# Patient Record
Sex: Female | Born: 2000 | Race: White | Hispanic: No | Marital: Single | Smoking: Never smoker
Health system: Southern US, Community
[De-identification: ages and names within clinical notes are randomized; demographics above are authoritative.]

## PROBLEM LIST (undated history)

## (undated) DIAGNOSIS — D649 Anemia, unspecified: Secondary | ICD-10-CM

## (undated) HISTORY — PX: PILONIDAL CYST / SINUS EXCISION: SUR543

## (undated) HISTORY — DX: Anemia, unspecified: D64.9

---
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
## 2020-06-20 ENCOUNTER — Emergency Department (HOSPITAL_BASED_OUTPATIENT_CLINIC_OR_DEPARTMENT_OTHER)
Admission: EM | Admit: 2020-06-20 | Discharge: 2020-06-21 | Disposition: A | Discharge status: Home / Self Care | Payer: Self-pay | Attending: Emergency Medicine | Admitting: Emergency Medicine

## 2020-06-20 ENCOUNTER — Encounter (HOSPITAL_BASED_OUTPATIENT_CLINIC_OR_DEPARTMENT_OTHER): Payer: Self-pay | Admitting: *Deleted

## 2020-06-20 ENCOUNTER — Other Ambulatory Visit: Payer: Self-pay

## 2020-06-20 DIAGNOSIS — L0231 Cutaneous abscess of buttock: Secondary | ICD-10-CM | POA: Insufficient documentation

## 2020-06-20 MED ORDER — LIDOCAINE-EPINEPHRINE (PF) 2 %-1:200000 IJ SOLN
10.0000 mL | Freq: Once | INTRAMUSCULAR | Status: AC
  Administered 2020-06-20: 10 mL
  Filled 2020-06-20: qty 20

## 2020-06-20 MED ORDER — LIDOCAINE-EPINEPHRINE-TETRACAINE (LET) TOPICAL GEL
3.0000 mL | Freq: Once | TOPICAL | Status: AC
Start: 2020-06-20 — End: 2020-06-20
  Administered 2020-06-20: 3 mL via TOPICAL
  Filled 2020-06-20: qty 3

## 2020-06-20 NOTE — ED Provider Notes (Signed)
[facility] EMERGENCY DEPARTMENT Provider Note   CSN: [id number] Arrival date & time: 06/20/20  1729     History Chief Complaint  Patient presents with  . Abscess    Patricia Bautista is a 20 y.o. female otherwise healthy up-to-date on all vaccinations including tetanus.  Patient presents with her mother today for concern of pilonidal cyst onset 4 days ago.  Patient reports pain to the superior left buttock which is been constant worsening moderate intensity sharp worsened with sitting nonradiating no alleviating factors.  Patient was unaware that she had a fever prior to arrival temperature of 99.6 F in the triage area.  She denies having any history of abscess before, denies rectal pain or blood in the stool.  Denies abdominal pain nausea vomiting or any additional concerns.  HPI     History reviewed. No pertinent past medical history.  There are no problems to display for this patient.   History reviewed. No pertinent surgical history.   OB History   No obstetric history on file.     No family history on file.  Social History   Tobacco Use  . Smoking status: Never Smoker  . Smokeless tobacco: Never Used  Substance Use Topics  . Alcohol use: Never  . Drug use: Never    Home Medications Prior to Admission medications   Medication Sig Start Date End Date Taking? Authorizing Provider  sulfamethoxazole-trimethoprim (BACTRIM DS) 800-160 MG tablet Take 1 tablet by mouth 2 (two) times daily for 7 days. 06/21/20 06/28/20 Yes Nuala Alpha A, PA-C    Allergies    Patient has no known allergies.  Review of Systems   Review of Systems  Constitutional: Negative for chills and fever (99.6 F).  Gastrointestinal: Negative.  Negative for abdominal pain, nausea and vomiting.  Skin: Positive for color change and wound (Abscess).    Physical Exam Updated Vital Signs BP 117/71 (BP Location: Right Arm)   Pulse (!) 103   Temp 98.4 F (36.9 C) (Oral)   Resp 16    Ht 5\' 5"  (1.651 m)   Wt 62.3 kg   LMP 05/23/2020   SpO2 100%   BMI 22.86 kg/m   Physical Exam Constitutional:      General: She is not in acute distress.    Appearance: Normal appearance. She is well-developed. She is not ill-appearing or diaphoretic.  HENT:     Head: Normocephalic and atraumatic.  Eyes:     General: Vision grossly intact. Gaze aligned appropriately.     Pupils: Pupils are equal, round, and reactive to light.  Neck:     Trachea: Trachea and phonation normal.  Pulmonary:     Effort: Pulmonary effort is normal. No respiratory distress.  Abdominal:     General: There is no distension.     Palpations: Abdomen is soft.     Tenderness: There is no abdominal tenderness. There is no guarding or rebound.  Musculoskeletal:        General: Normal range of motion.     Cervical back: Normal range of motion.  Skin:    General: Skin is warm and dry.          Comments: Evaluation chaperoned by patient's mother and Passenger transport manager.  Patient with abscess at the superior left buttock, moderate erythema and fluctuance present approximately 1.5 cm in diameter.  No tracking towards the rectum.  Neurological:     Mental Status: She is alert.     GCS: GCS eye subscore is  4. GCS verbal subscore is 5. GCS motor subscore is 6.     Comments: Speech is clear and goal oriented, follows commands Major Cranial nerves without deficit, no facial droop Moves extremities without ataxia, coordination intact  Psychiatric:        Behavior: Behavior normal.     ED Results / Procedures / Treatments   Labs (all labs ordered are listed, but only abnormal results are displayed) Labs Reviewed  AEROBIC CULTURE (SUPERFICIAL SPECIMEN)  PREGNANCY, URINE    EKG None  Radiology No results found.  Procedures Ultrasound ED Soft Tissue  Date/Time: 06/20/2020 10:57 PM Performed by: Patricia Salinas, PA-C Authorized by: Patricia Salinas, PA-C   Procedure details:    Indications: localization  of abscess and evaluate for cellulitis     Transverse view:  Visualized   Longitudinal view:  Visualized   Images: archived   Location:    Location: buttocks     Side:  Left Findings:     abscess present    cellulitis present    no foreign body present Comments:     Ultrasound exam chaperoned by Patricia Bautista and patient's mother.  Marland Kitchen.Incision and Drainage  Date/Time: 06/20/2020 11:27 PM Performed by: Patricia Salinas, PA-C Authorized by: Patricia Salinas, PA-C   Consent:    Consent obtained:  Verbal   Consent given by:  Patient and parent   Risks, benefits, and alternatives were discussed: yes     Risks discussed:  Bleeding, incomplete drainage, infection, damage to other organs and pain Universal protocol:    Procedure explained and questions answered to patient or proxy's satisfaction: yes     Relevant documents present and verified: yes     Test results available : yes     Imaging studies available: yes     Required blood products, implants, devices, and special equipment available: yes     Site/side marked: yes     Immediately prior to procedure, a time out was called: yes     Patient identity confirmed:  Verbally with patient and arm band Location:    Type:  Abscess   Size:  1.5 cm   Location:  Lower extremity   Lower extremity location:  Buttock   Buttock location:  L buttock Pre-procedure details:    Skin preparation:  Povidone-iodine Anesthesia:    Anesthesia method:  Topical application and local infiltration   Topical anesthetic:  LET   Local anesthetic:  Lidocaine 1% w/o epi Procedure type:    Complexity:  Simple Procedure details:    Ultrasound guidance: yes     Needle aspiration: yes     Needle size:  25 G   Incision types:  Single straight   Drainage:  Purulent and bloody   Drainage amount:  Scant   Wound treatment:  Wound left open   Packing materials:  1/4 in iodoform gauze   Amount 1/4" iodoform:  1cm in 1 cm out Post-procedure details:     Procedure completion:  Tolerated well, no immediate complications Comments:     Procedure chaperoned by Patricia Bautista and patient's mother.   (including critical care time)  Medications Ordered in ED Medications  lidocaine-EPINEPHrine (XYLOCAINE W/EPI) 2 %-1:200000 (PF) injection 10 mL (10 mLs Infiltration Given 06/20/20 2255)  lidocaine-EPINEPHrine-tetracaine (LET) topical gel (3 mLs Topical Given 06/20/20 2255)    ED Course  I have reviewed the triage vital signs and the nursing notes.  Pertinent labs & imaging results that were available during my  care of the patient were reviewed by me and considered in my medical decision making (see chart for details).    MDM Rules/Calculators/A&P                         Additional history obtained from: 1. Nursing notes from this visit. 2. Family, patient's mother at bedside. ---------- Patient with single skin abscess of the right superior buttock amenable to incision and drainage.  Abscess does not appear to be a pilonidal at this time seems more lateral. Abscess drained as above small amount of packing was placed. Patient informed to have wound recheck in 2 days. I have encouraged home warm soaks and flushing. Plan to discharge patient. Patient given extensive return precautions. Patient states understanding of return precautions and is agreeable with plan.  Will start patient on Bactrim for surrounding cellulitis.  Urine pregnancy test negative.  There is no indication for imaging at this time no tracking or rectal pain to suggest perianal abscess.  Additionally vital signs are stable patient minimally tachycardic suspect secondary to pain no true fever patient does not appear toxic or septic and appears stable for outpatient treatment.  Wound culture sent, patient and mother informed that if it grows bacteria that require different antibiotic you will be contacted by [facility] with a new prescription.  Referral given to [facility] surgery at family  request in case pilonidal involvement.  At this time there does not appear to be any evidence of an acute emergency medical condition and the patient appears stable for discharge with appropriate outpatient follow up. Diagnosis was discussed with patient who verbalizes understanding of care plan and is agreeable to discharge. I have discussed return precautions with patient and mother who verbalizes understanding. Patient encouraged to follow-up with their PCP. All questions answered.   Note: Portions of this report may have been transcribed using voice recognition software. Every effort was made to ensure accuracy; however, inadvertent computerized transcription errors may still be present. Final Clinical Impression(s) / ED Diagnoses Final diagnoses:  Abscess of buttock, left    Rx / DC Orders ED Discharge Orders         Ordered    sulfamethoxazole-trimethoprim (BACTRIM DS) 800-160 MG tablet  2 times daily        06/21/20 0002           Elizabeth Palau 06/21/20 0003    Tilden Fossa, MD 06/24/20 1909

## 2020-06-20 NOTE — Discharge Instructions (Addendum)
At this time there does not appear to be the presence of an emergent medical condition, however there is always the potential for conditions to change. Please read and follow the below instructions.  Please return to the Emergency Department immediately for any new or worsening symptoms. Please be sure to follow up with your Primary Care Provider within one week regarding your visit today; please call their office to schedule an appointment even if you are feeling better for a follow-up visit. Please take your antibiotic Bactrim as prescribed until complete to help with your symptoms.  Please drink enough water to avoid dehydration and get plenty of rest. You are referred to [facility] surgical services today. It does not appear that your abscess with today was due to a pilonidal cyst rather than a normal skin abscess however the specialist of [place] surgery can follow-up if the abscess returns. Please have the area rechecked in 2-3 days, can be rechecked by your primary care provider, and urgent care or here at the emergency department.  Go to the nearest Emergency Department immediately if: You have fever or chills You have very bad (severe) pain. You see red streaks on your skin spreading away from the abscess. You have abdominal pain nausea or vomiting You have pain with bowel movements (pain when you poop) You have blood in your poop You have any new/concerning or worsening of symptoms.   Please read the additional information packets attached to your discharge summary.  Do not take your medicine if  develop an itchy rash, swelling in your mouth or lips, or difficulty breathing; call 911 and seek immediate emergency medical attention if this occurs.  You may review your lab tests and imaging results in their entirety on your MyChart account.  Please discuss all results of fully with your primary care provider and other specialist at your follow-up visit.  Note: Portions  of this text may have been transcribed using voice recognition software. Every effort was made to ensure accuracy; however, inadvertent computerized transcription errors may still be present.

## 2020-06-20 NOTE — ED Notes (Signed)
Motrin and tylenol offered for low grade fever.  Pt declined at this time.

## 2020-06-20 NOTE — ED Triage Notes (Signed)
Pilonidal cyst x 4 days.

## 2020-06-21 LAB — PREGNANCY, URINE: Preg Test, Ur: NEGATIVE

## 2020-06-21 MED ORDER — SULFAMETHOXAZOLE-TRIMETHOPRIM 800-160 MG PO TABS: 1.0000 | ORAL_TABLET | Freq: Two times a day (BID) | ORAL | 0 refills | Status: AC

## 2020-06-22 LAB — AEROBIC CULTURE  (SUPERFICIAL SPECIMEN)

## 2020-06-23 LAB — AEROBIC CULTURE W GRAM STAIN (SUPERFICIAL SPECIMEN): Culture: NORMAL

## 2021-01-22 ENCOUNTER — Ambulatory Visit: Payer: Medicaid Other

## 2021-01-22 NOTE — Progress Notes (Signed)
Call patient x 2 for new ob intake appointment. No answer, voice mail full.

## 2021-01-28 ENCOUNTER — Ambulatory Visit (INDEPENDENT_AMBULATORY_CARE_PROVIDER_SITE_OTHER): Payer: Medicaid Other | Admitting: Obstetrics and Gynecology

## 2021-01-28 ENCOUNTER — Other Ambulatory Visit (HOSPITAL_COMMUNITY)
Admission: RE | Admit: 2021-01-28 | Discharge: 2021-01-28 | Disposition: A | Discharge status: Home / Self Care | Payer: Medicaid Other | Source: Ambulatory Visit | Attending: Obstetrics and Gynecology | Admitting: Obstetrics and Gynecology

## 2021-01-28 ENCOUNTER — Encounter: Payer: Self-pay | Admitting: Obstetrics and Gynecology

## 2021-01-28 ENCOUNTER — Other Ambulatory Visit: Payer: Self-pay

## 2021-01-28 DIAGNOSIS — Z3A17 17 weeks gestation of pregnancy: Secondary | ICD-10-CM | POA: Insufficient documentation

## 2021-01-28 DIAGNOSIS — Z3685 Encounter for antenatal screening for Streptococcus B: Secondary | ICD-10-CM | POA: Diagnosis not present

## 2021-01-28 DIAGNOSIS — Z3480 Encounter for supervision of other normal pregnancy, unspecified trimester: Secondary | ICD-10-CM | POA: Diagnosis not present

## 2021-01-28 DIAGNOSIS — O219 Vomiting of pregnancy, unspecified: Secondary | ICD-10-CM | POA: Insufficient documentation

## 2021-01-28 DIAGNOSIS — O99012 Anemia complicating pregnancy, second trimester: Secondary | ICD-10-CM

## 2021-01-28 DIAGNOSIS — Z31430 Encounter of female for testing for genetic disease carrier status for procreative management: Secondary | ICD-10-CM | POA: Diagnosis not present

## 2021-01-28 DIAGNOSIS — Z3482 Encounter for supervision of other normal pregnancy, second trimester: Secondary | ICD-10-CM | POA: Diagnosis not present

## 2021-01-28 MED ORDER — DOXYLAMINE-PYRIDOXINE 10-10 MG PO TBEC: 2.0000 | DELAYED_RELEASE_TABLET | Freq: Every day | ORAL | 5 refills | Status: DC

## 2021-01-28 MED ORDER — BLOOD PRESSURE KIT DEVI
1.0000 | 0 refills | Status: DC | PRN
Start: 2021-01-28 — End: 2021-07-01

## 2021-01-28 NOTE — Addendum Note (Signed)
Addended by: Charlsie Quest B on: 01/28/2021 10:29 AM   Modules accepted: Orders

## 2021-01-28 NOTE — Progress Notes (Signed)
Pt presents today for NOB visit. Pt states she is having issues with morning N/V. Only vomiting once in the morning. She is not currently taking anything.

## 2021-01-28 NOTE — Patient Instructions (Signed)
The nature of [facility] with multiple MDs and other Advanced Practice Providers was explained to patient; also emphasized that residents, students are part of our team.   For colds and allergies  Any anti-histamine including benadryl, allegra, claritin, etc.  Sudafed but not phenylephrine  Mucinex  Robitussin  For Reflux/heartburn  Pepcid Zantac Tums Prilosec Prevacid  For yeast infections  Monistat  For constipation  Colace  For minor aches and pains  Tylenol-do not take more than 4000mg in 24 hours. Therma-care or like heat packs  

## 2021-01-28 NOTE — Progress Notes (Signed)
INITIAL PRENATAL VISIT NOTE  Subjective:  Patricia Bautista is a 20 y.o. G1P0 at [redacted]w[redacted]d by sure LMP being seen today for her initial prenatal visit. She has an obstetric history significant for nothing. She has a medical history significant for uncomplicated medical history.  Patient reports nausea.  Contractions: Not present. Vag. Bleeding: None.  Movement: Absent. Denies leaking of fluid.    No past medical history on file.  No past surgical history on file.  OB History  Gravida Para Term Preterm AB Living  1            SAB IAB Ectopic Multiple Live Births               # Outcome Date GA Lbr Len/2nd Weight Sex Delivery Anes PTL Lv  1 Current             Social History   Socioeconomic History   Marital status: Single    Spouse name: Not on file   Number of children: Not on file   Years of education: Not on file   Highest education level: Not on file  Occupational History   Not on file  Tobacco Use   Smoking status: Never   Smokeless tobacco: Never  Substance and Sexual Activity   Alcohol use: Never   Drug use: Never   Sexual activity: Not on file  Other Topics Concern   Not on file  Social History Narrative   Not on file   Social Determinants of Health   Financial Resource Strain: Not on file  Food Insecurity: Not on file  Transportation Needs: Not on file  Physical Activity: Not on file  Stress: Not on file  Social Connections: Not on file    No family history on file.   Current Outpatient Medications:    Doxylamine-Pyridoxine (DICLEGIS) 10-10 MG TBEC, Take 2 tablets by mouth at bedtime. If symptoms persist, add one tablet in the morning and one in the afternoon, Disp: 100 tablet, Rfl: 5   Prenatal MV & Min w/FA-DHA (PRENATAL GUMMIES) 0.18-25 MG CHEW, Chew by mouth., Disp: , Rfl:   No Known Allergies  Review of Systems: Negative except for what is mentioned in HPI.  Objective:   Vitals:   01/28/21 0909  BP: 116/72  Pulse: 91  Weight: 141 lb (64  kg)    Fetal Status: Fetal Heart Rate (bpm): 152   Movement: Absent     Physical Exam: BP 116/72   Pulse 91   Wt 141 lb (64 kg)   LMP 09/28/2020 (Exact Date)   BMI 23.46 kg/m  CONSTITUTIONAL: Well-developed, well-nourished female in no acute distress.  NEUROLOGIC: Alert and oriented to person, place, and time. Normal reflexes, muscle tone coordination. No cranial nerve deficit noted. PSYCHIATRIC: Normal mood and affect. Normal behavior. Normal judgment and thought content. SKIN: Skin is warm and dry. No rash noted. Not diaphoretic. No erythema. No pallor. HENT:  Normocephalic, atraumatic, External right and left ear normal. Oropharynx is clear and moist EYES: Conjunctivae and EOM are normal.  NECK: Normal range of motion, supple, no masses CARDIOVASCULAR: Normal heart rate noted, regular rhythm RESPIRATORY: Effort and breath sounds normal, no problems with respiration noted BREASTS: deferred ABDOMEN: Soft, nontender, nondistended, gravid. GU: normal appearing external female genitalia, nulliparous normal appearing cervix, scant white discharge in vagina, no lesions noted Bimanual: 18 weeks sized uterus, no adnexal tenderness or palpable lesions noted MUSCULOSKELETAL: Normal range of motion. EXT:  No edema and no tenderness. 2+ distal  pulses.   Assessment and Plan:  Pregnancy: G1P0 at [redacted]w[redacted]d by LMP  1. Supervision of other normal pregnancy, antepartum Continue routine care, will schedule anatomy scan  - Cervicovaginal ancillary only( [place]) - Culture, OB Urine - CBC/D/Plt+RPR+Rh+ABO+RubIgG... - Genetic Screening - AFP, Serum, Open Spina Bifida - [place] MFM OB DETAIL +14 WK; Future  2. [redacted] weeks gestation of pregnancy   3. Nausea and vomiting in pregnancy Trial of diclegis  - Doxylamine-Pyridoxine (DICLEGIS) 10-10 MG TBEC; Take 2 tablets by mouth at bedtime. If symptoms persist, add one tablet in the morning and one in the afternoon  Dispense: 100 tablet; Refill:  5   Preterm labor symptoms and general obstetric precautions including but not limited to vaginal bleeding, contractions, leaking of fluid and fetal movement were reviewed in detail with the patient.  Please refer to After Visit Summary for other counseling recommendations.   Return in about 4 weeks (around 02/25/2021) for ROB, in person.  Warden Fillers 01/28/2021 9:47 AM

## 2021-01-29 LAB — CERVICOVAGINAL ANCILLARY ONLY
Chlamydia: NEGATIVE
Comment: NEGATIVE
Comment: NEGATIVE
Comment: NORMAL
Neisseria Gonorrhea: NEGATIVE
Trichomonas: NEGATIVE

## 2021-01-30 LAB — CBC/D/PLT+RPR+RH+ABO+RUBIGG...
Antibody Screen: NEGATIVE
Basophils Absolute: 0.1 10*3/uL (ref 0.0–0.2)
Basos: 1 %
EOS (ABSOLUTE): 0.1 10*3/uL (ref 0.0–0.4)
Eos: 1 %
HCV Ab: <0.1 s/co ratio (ref 0.0–0.9)
HIV Screen 4th Generation wRfx: NONREACTIVE
Hematocrit: 27.5 % — ABNORMAL LOW (ref 34.0–46.6)
Hemoglobin: 7.7 g/dL — ABNORMAL LOW (ref 11.1–15.9)
Hepatitis B Surface Ag: NEGATIVE
Immature Grans (Abs): 0.1 10*3/uL (ref 0.0–0.1)
Immature Granulocytes: 1 %
Lymphocytes Absolute: 1.6 10*3/uL (ref 0.7–3.1)
Lymphs: 16 %
MCH: 19 pg — ABNORMAL LOW (ref 26.6–33.0)
MCHC: 28 g/dL — ABNORMAL LOW (ref 31.5–35.7)
MCV: 68 fL — ABNORMAL LOW (ref 79–97)
Monocytes Absolute: 0.5 10*3/uL (ref 0.1–0.9)
Monocytes: 5 %
Neutrophils Absolute: 7.8 10*3/uL — ABNORMAL HIGH (ref 1.4–7.0)
Neutrophils: 76 %
Platelets: 296 10*3/uL (ref 150–450)
RBC: 4.06 x10E6/uL (ref 3.77–5.28)
RDW: 18.2 % — ABNORMAL HIGH (ref 11.7–15.4)
RPR Ser Ql: NONREACTIVE
Rh Factor: POSITIVE
Rubella Antibodies, IGG: 9.62 index (ref >0.99)
WBC: 10 10*3/uL (ref 3.4–10.8)

## 2021-01-30 LAB — AFP, SERUM, OPEN SPINA BIFIDA
AFP MoM: 1.28
AFP Value: 55.2 ng/mL
Gest. Age on Collection Date: 17.3 weeks
Maternal Age At EDD: 21 yr
OSBR Risk 1 IN: 5096
Test Results:: NEGATIVE
Weight: 141 [lb_av]

## 2021-01-30 LAB — CULTURE, OB URINE

## 2021-01-30 LAB — HCV INTERPRETATION

## 2021-01-30 LAB — URINE CULTURE, OB REFLEX: Organism ID, Bacteria: NO GROWTH

## 2021-02-02 DIAGNOSIS — O99012 Anemia complicating pregnancy, second trimester: Secondary | ICD-10-CM | POA: Insufficient documentation

## 2021-02-02 MED ORDER — IRON SUCROSE 20 MG/ML IV SOLN
500.0000 mg | Freq: Once | INTRAVENOUS | Status: DC
Start: 2021-02-02 — End: 2021-02-02

## 2021-02-02 NOTE — Addendum Note (Signed)
Addended by: Warden Fillers on: 02/02/2021 08:19 AM   Modules accepted: Orders

## 2021-02-03 ENCOUNTER — Ambulatory Visit: Payer: Medicaid Other | Attending: Obstetrics and Gynecology

## 2021-02-03 ENCOUNTER — Encounter: Payer: Self-pay | Admitting: *Deleted

## 2021-02-03 ENCOUNTER — Other Ambulatory Visit: Payer: Self-pay

## 2021-02-03 ENCOUNTER — Other Ambulatory Visit: Payer: Self-pay | Admitting: Obstetrics and Gynecology

## 2021-02-03 ENCOUNTER — Ambulatory Visit: Payer: Medicaid Other | Admitting: *Deleted

## 2021-02-03 VITALS — BP 116/61 | HR 104

## 2021-02-03 DIAGNOSIS — Z3480 Encounter for supervision of other normal pregnancy, unspecified trimester: Secondary | ICD-10-CM | POA: Insufficient documentation

## 2021-02-05 ENCOUNTER — Encounter: Payer: Self-pay | Admitting: Obstetrics and Gynecology

## 2021-02-11 ENCOUNTER — Encounter: Payer: Self-pay | Admitting: Obstetrics and Gynecology

## 2021-02-12 ENCOUNTER — Ambulatory Visit (HOSPITAL_COMMUNITY)
Admission: RE | Admit: 2021-02-12 | Discharge: 2021-02-12 | Disposition: A | Discharge status: Home / Self Care | Payer: Medicaid Other | Source: Ambulatory Visit | Attending: Obstetrics and Gynecology | Admitting: Obstetrics and Gynecology

## 2021-02-12 DIAGNOSIS — O99012 Anemia complicating pregnancy, second trimester: Secondary | ICD-10-CM | POA: Diagnosis not present

## 2021-02-12 MED ORDER — SODIUM CHLORIDE 0.9 % IV SOLN
500.0000 mg | Freq: Once | INTRAVENOUS | Status: AC
  Administered 2021-02-12: 500 mg via INTRAVENOUS
  Filled 2021-02-12: qty 25

## 2021-02-12 NOTE — Progress Notes (Signed)
Patient stated she noticed little swelling to right hand when her 30 minutes observation period was complete and she was getting ready for discharge. Patient denies any other symptoms, will monitor patient little longer.

## 2021-02-18 ENCOUNTER — Inpatient Hospital Stay (HOSPITAL_COMMUNITY): Admission: RE | Admit: 2021-02-18 | Payer: Medicaid Other | Source: Ambulatory Visit

## 2021-02-25 ENCOUNTER — Encounter: Payer: Medicaid Other | Admitting: Women's Health

## 2021-03-02 ENCOUNTER — Other Ambulatory Visit: Payer: Self-pay

## 2021-03-02 ENCOUNTER — Encounter: Payer: Self-pay | Admitting: Advanced Practice Midwife

## 2021-03-02 ENCOUNTER — Ambulatory Visit (INDEPENDENT_AMBULATORY_CARE_PROVIDER_SITE_OTHER): Payer: Medicaid Other | Admitting: Advanced Practice Midwife

## 2021-03-02 VITALS — BP 110/72 | HR 97 | Wt 148.0 lb

## 2021-03-02 DIAGNOSIS — Z3A22 22 weeks gestation of pregnancy: Secondary | ICD-10-CM

## 2021-03-02 DIAGNOSIS — Z3480 Encounter for supervision of other normal pregnancy, unspecified trimester: Secondary | ICD-10-CM

## 2021-03-02 DIAGNOSIS — O99012 Anemia complicating pregnancy, second trimester: Secondary | ICD-10-CM

## 2021-03-02 DIAGNOSIS — O99891 Other specified diseases and conditions complicating pregnancy: Secondary | ICD-10-CM

## 2021-03-02 DIAGNOSIS — R0981 Nasal congestion: Secondary | ICD-10-CM

## 2021-03-02 MED ORDER — FERROUS SULFATE 325 (65 FE) MG PO TABS: 325.0000 mg | ORAL_TABLET | ORAL | 1 refills | Status: DC

## 2021-03-02 NOTE — Patient Instructions (Signed)
Safe Medications in Pregnancy  ° °Acne: °Benzoyl Peroxide °Salicylic Acid ° °Backache/Headache: °Tylenol: 2 regular strength every 4 hours OR °             2 Extra strength every 6 hours ° °Colds/Coughs/Allergies: °Benadryl (alcohol free) 25 mg every 6 hours as needed °Breath right strips °Claritin °Cepacol throat lozenges °Chloraseptic throat spray °Cold-Eeze- up to three times per day °Cough drops, alcohol free °Flonase  °Guaifenesin °Mucinex °Robitussin DM (plain only, alcohol free) °Saline nasal spray/drops °Sudafed (pseudoephedrine) & Actifed ** use only after [redacted] weeks gestation and if you do not have high blood pressure °Tylenol °Vicks Vaporub °Zinc lozenges °Zyrtec  ° °Constipation: °Colace °Ducolax suppositories °Fleet enema °Glycerin suppositories °Metamucil °Milk of magnesia °Miralax °Senokot °Smooth move tea ° °Diarrhea: °Kaopectate °Imodium A-D ° °*NO pepto Bismol ° °Hemorrhoids: °Anusol °Anusol HC °Preparation H °Tucks ° °Indigestion: °Tums °Maalox °Mylanta °Zantac  °Pepcid ° °Insomnia: °Benadryl (alcohol free) 25mg every 6 hours as needed °Tylenol PM °Unisom, no Gelcaps ° °Leg Cramps: °Tums °MagGel ° °Nausea/Vomiting:  °Bonine °Dramamine °Emetrol °Ginger extract °Sea bands °Meclizine  °Nausea medication to take during pregnancy:  °Unisom (doxylamine succinate 25 mg tablets) Take one tablet daily at bedtime. If symptoms are not adequately controlled, the dose can be increased to a maximum recommended dose of two tablets daily (1/2 tablet in the morning, 1/2 tablet mid-afternoon and one at bedtime). °Vitamin B6 100mg tablets. Take one tablet twice a day (up to 200 mg per day). ° °Skin Rashes: °Aveeno products °Benadryl cream or 25mg every 6 hours as needed °Calamine Lotion °1% cortisone cream ° °Yeast infection: °Gyne-lotrimin 7 °Monistat 7 ° ° °**If taking multiple medications, please check labels to avoid duplicating the same active ingredients °**take medication as directed on the label °** Do not  exceed 4000 mg of tylenol in 24 hours °**Do not take medications that contain aspirin or ibuprofen ° °  °

## 2021-03-02 NOTE — Progress Notes (Signed)
ROB [redacted]w[redacted]d  C: None

## 2021-03-02 NOTE — Progress Notes (Signed)
   PRENATAL VISIT NOTE  Subjective:  Patricia Bautista is a 20 y.o. G1P0 at [redacted]w[redacted]d being seen today for ongoing prenatal care.  She is currently monitored for the following issues for this low-risk pregnancy and has Supervision of other normal pregnancy, antepartum; [redacted] weeks gestation of pregnancy; Nausea and vomiting in pregnancy; and Anemia complicating pregnancy in second trimester on their problem list.  Patient reports  nasal congestion x 2 weeks, no other symptoms .  Contractions: Not present. Vag. Bleeding: None.  Movement: Present. Denies leaking of fluid.   The following portions of the patient's history were reviewed and updated as appropriate: allergies, current medications, past family history, past medical history, past social history, past surgical history and problem list.   Objective:   Vitals:   03/02/21 1513  BP: 110/72  Pulse: 97  Weight: 148 lb (67.1 kg)    Fetal Status: Fetal Heart Rate (bpm): 153 Fundal Height: 23 cm Movement: Present     General:  Alert, oriented and cooperative. Patient is in no acute distress.  Skin: Skin is warm and dry. No rash noted.   Cardiovascular: Normal heart rate noted  Respiratory: Normal respiratory effort, no problems with respiration noted  Abdomen: Soft, gravid, appropriate for gestational age.  Pain/Pressure: Absent     Pelvic: Cervical exam deferred        Extremities: Normal range of motion.  Edema: None  Mental Status: Normal mood and affect. Normal behavior. Normal judgment and thought content.   Assessment and Plan:  Pregnancy: G1P0 at [redacted]w[redacted]d 1. Supervision of other normal pregnancy, antepartum --Anticipatory guidance about next visits/weeks of pregnancy given. --Next visit in 4 weeks for GTT  2. [redacted] weeks gestation of pregnancy   3. Anemia complicating pregnancy in second trimester --s/p iron infusion, no s/sx  - ferrous sulfate (FERROUSUL) 325 (65 FE) MG tablet; Take 1 tablet (325 mg total) by mouth every other day.   Dispense: 30 tablet; Refill: 1  4. Nasal congestion related to pregnancy --Likely physiologic but some allergic component. List of safe OTC medications given to pt, discussed options like Flonase and/or Zyrtec with patient.  Preterm labor symptoms and general obstetric precautions including but not limited to vaginal bleeding, contractions, leaking of fluid and fetal movement were reviewed in detail with the patient. Please refer to After Visit Summary for other counseling recommendations.   Return in about 4 weeks (around 03/30/2021).  Future Appointments  Date Time Provider Department Center  03/30/2021  3:30 PM Hurshel Party, CNM [facility] None  04/13/2021  8:00 AM [facility] None     Sharen Counter, CNM

## 2021-03-30 ENCOUNTER — Other Ambulatory Visit: Payer: Self-pay

## 2021-03-30 ENCOUNTER — Ambulatory Visit (INDEPENDENT_AMBULATORY_CARE_PROVIDER_SITE_OTHER): Payer: Medicaid Other | Admitting: Advanced Practice Midwife

## 2021-03-30 VITALS — BP 127/70 | HR 95 | Wt 160.2 lb

## 2021-03-30 DIAGNOSIS — Z3A26 26 weeks gestation of pregnancy: Secondary | ICD-10-CM

## 2021-03-30 DIAGNOSIS — Z3480 Encounter for supervision of other normal pregnancy, unspecified trimester: Secondary | ICD-10-CM

## 2021-03-30 DIAGNOSIS — O99012 Anemia complicating pregnancy, second trimester: Secondary | ICD-10-CM

## 2021-03-30 NOTE — Progress Notes (Signed)
Patient presents for ROB. Patient has no concerns today. She declines flu vaccine.

## 2021-03-30 NOTE — Progress Notes (Signed)
   PRENATAL VISIT NOTE  Subjective:  Patricia Bautista is a 20 y.o. G1P0 at [redacted]w[redacted]d being seen today for ongoing prenatal care.  She is currently monitored for the following issues for this low-risk pregnancy and has Supervision of other normal pregnancy, antepartum; [redacted] weeks gestation of pregnancy; Nausea and vomiting in pregnancy; and Anemia complicating pregnancy in second trimester on their problem list.  Patient reports no complaints.  Contractions: Not present. Vag. Bleeding: None.  Movement: Present. Denies leaking of fluid.   The following portions of the patient's history were reviewed and updated as appropriate: allergies, current medications, past family history, past medical history, past social history, past surgical history and problem list.   Objective:   Vitals:   03/30/21 1535  BP: 127/70  Pulse: 95  Weight: 160 lb 3.2 oz (72.7 kg)    Fetal Status: Fetal Heart Rate (bpm): 150 Fundal Height: 27 cm Movement: Present     General:  Alert, oriented and cooperative. Patient is in no acute distress.  Skin: Skin is warm and dry. No rash noted.   Cardiovascular: Normal heart rate noted  Respiratory: Normal respiratory effort, no problems with respiration noted  Abdomen: Soft, gravid, appropriate for gestational age.  Pain/Pressure: Absent     Pelvic: Cervical exam deferred        Extremities: Normal range of motion.  Edema: None  Mental Status: Normal mood and affect. Normal behavior. Normal judgment and thought content.   Assessment and Plan:  Pregnancy: G1P0 at [redacted]w[redacted]d 1. Supervision of other normal pregnancy, antepartum --Anticipatory guidance about next visits/weeks of pregnancy given. --Next visit in 2 weeks as scheduled for GTT  2. Anemia complicating pregnancy in second trimester --No s/sx --Pt had Venofer infusion outpatient and is taking PO iron   3. [redacted] weeks gestation of pregnancy   Preterm labor symptoms and general obstetric precautions including but not limited  to vaginal bleeding, contractions, leaking of fluid and fetal movement were reviewed in detail with the patient. Please refer to After Visit Summary for other counseling recommendations.   Return in about 2 weeks (around 04/13/2021).  Future Appointments  Date Time Provider Department Center  04/13/2021  8:00 AM [facility] None  04/13/2021  9:35 AM Brand Males, CNM [facility] None    Sharen Counter, CNM

## 2021-04-13 ENCOUNTER — Other Ambulatory Visit: Payer: Self-pay

## 2021-04-13 ENCOUNTER — Other Ambulatory Visit: Payer: Medicaid Other

## 2021-04-13 ENCOUNTER — Ambulatory Visit (INDEPENDENT_AMBULATORY_CARE_PROVIDER_SITE_OTHER): Payer: Medicaid Other

## 2021-04-13 VITALS — BP 111/70 | HR 94 | Wt 156.0 lb

## 2021-04-13 DIAGNOSIS — Z3480 Encounter for supervision of other normal pregnancy, unspecified trimester: Secondary | ICD-10-CM | POA: Diagnosis not present

## 2021-04-13 DIAGNOSIS — Z3A28 28 weeks gestation of pregnancy: Secondary | ICD-10-CM

## 2021-04-13 DIAGNOSIS — O99013 Anemia complicating pregnancy, third trimester: Secondary | ICD-10-CM

## 2021-04-13 NOTE — Progress Notes (Signed)
   PRENATAL VISIT NOTE  Subjective:  Patricia Bautista is a 20 y.o. G1P0 at [redacted]w[redacted]d being seen today for ongoing prenatal care.  She is currently monitored for the following issues for this low-risk pregnancy and has Supervision of other normal pregnancy, antepartum; [redacted] weeks gestation of pregnancy; Nausea and vomiting in pregnancy; and Anemia complicating pregnancy in second trimester on their problem list.  Patient reports no complaints.  Contractions: Not present. Vag. Bleeding: None.  Movement: Present. Denies leaking of fluid.   The following portions of the patient's history were reviewed and updated as appropriate: allergies, current medications, past family history, past medical history, past social history, past surgical history and problem list.   Objective:   Vitals:   04/13/21 0820  BP: 111/70  Pulse: 94  Weight: 156 lb (70.8 kg)    Fetal Status: Fetal Heart Rate (bpm): 145 Fundal Height: 29 cm Movement: Present     General:  Alert, oriented and cooperative. Patient is in no acute distress.  Skin: Skin is warm and dry. No rash noted.   Cardiovascular: Normal heart rate noted  Respiratory: Normal respiratory effort, no problems with respiration noted  Abdomen: Soft, gravid, appropriate for gestational age.  Pain/Pressure: Absent     Pelvic: Cervical exam deferred        Extremities: Normal range of motion.  Edema: None  Mental Status: Normal mood and affect. Normal behavior. Normal judgment and thought content.   Assessment and Plan:  Pregnancy: G1P0 at [redacted]w[redacted]d 1. [redacted] weeks gestation of pregnancy  - Glucose Tolerance, 2 Hours w/1 Hour - RPR - CBC - HIV antibody (with reflex)  2. Supervision of other normal pregnancy, antepartum - Routine OB care - Doing well. No concerns today - Declines Tdap. Reviewed recommendations - Reviewed hospital location and MAU if needed - Anticipatory guidance for upcoming appointments   3. Anemia affecting pregnancy in third trimester -  S/p Venofer, on oral iron - Repeat CBC today - Asymptomatic    Preterm labor symptoms and general obstetric precautions including but not limited to vaginal bleeding, contractions, leaking of fluid and fetal movement were reviewed in detail with the patient. Please refer to After Visit Summary for other counseling recommendations.   Return in about 2 weeks (around 04/27/2021).  Future Appointments  Date Time Provider Department Center  04/13/2021  9:35 AM Brand Males, CNM [facility] None  04/27/2021  3:30 PM Leftwich-Kirby, Wilmer Floor, CNM [facility] None     Brand Males, CNM 04/13/21 9:15 AM

## 2021-04-13 NOTE — Progress Notes (Signed)
ROB/GTT.  TDAP vaccine declined.  Reports no complaints today.

## 2021-04-14 LAB — CBC
Hematocrit: 32.7 % — ABNORMAL LOW (ref 34.0–46.6)
Hemoglobin: 10 g/dL — ABNORMAL LOW (ref 11.1–15.9)
MCH: 24.6 pg — ABNORMAL LOW (ref 26.6–33.0)
MCHC: 30.6 g/dL — ABNORMAL LOW (ref 31.5–35.7)
MCV: 80 fL (ref 79–97)
Platelets: 219 10*3/uL (ref 150–450)
RBC: 4.07 x10E6/uL (ref 3.77–5.28)
RDW: 22.7 % — ABNORMAL HIGH (ref 11.7–15.4)
WBC: 5.7 10*3/uL (ref 3.4–10.8)

## 2021-04-14 LAB — GLUCOSE TOLERANCE, 2 HOURS W/ 1HR
Glucose, 1 hour: 157 mg/dL (ref 70–179)
Glucose, 2 hour: 150 mg/dL (ref 70–152)
Glucose, Fasting: 69 mg/dL — ABNORMAL LOW (ref 70–91)

## 2021-04-14 LAB — RPR: RPR Ser Ql: NONREACTIVE

## 2021-04-14 LAB — HIV ANTIBODY (ROUTINE TESTING W REFLEX): HIV Screen 4th Generation wRfx: NONREACTIVE

## 2021-04-27 ENCOUNTER — Encounter: Payer: Medicaid Other | Admitting: Advanced Practice Midwife

## 2021-05-12 ENCOUNTER — Encounter: Payer: Self-pay | Admitting: Obstetrics

## 2021-05-12 ENCOUNTER — Ambulatory Visit (INDEPENDENT_AMBULATORY_CARE_PROVIDER_SITE_OTHER): Payer: Medicaid Other | Admitting: Obstetrics

## 2021-05-12 ENCOUNTER — Other Ambulatory Visit: Payer: Self-pay

## 2021-05-12 VITALS — BP 114/69 | HR 77 | Wt 165.3 lb

## 2021-05-12 DIAGNOSIS — Z3480 Encounter for supervision of other normal pregnancy, unspecified trimester: Secondary | ICD-10-CM

## 2021-05-12 NOTE — Progress Notes (Addendum)
Subjective:  Patricia Bautista is a 20 y.o. G1P0 at [redacted]w[redacted]d being seen today for ongoing prenatal care.  She is currently monitored for the following issues for this low-risk pregnancy and has Supervision of other normal pregnancy, antepartum; [redacted] weeks gestation of pregnancy; Nausea and vomiting in pregnancy; and Anemia complicating pregnancy in second trimester on their problem list.  Patient reports no complaints.  Contractions: Not present. Vag. Bleeding: None.  Movement: Present. Denies leaking of fluid.   The following portions of the patient's history were reviewed and updated as appropriate: allergies, current medications, past family history, past medical history, past social history, past surgical history and problem list. Problem list updated.  Objective:   Vitals:   05/12/21 1600  BP: 114/69  Pulse: 77  Weight: 165 lb 4.8 oz (75 kg)    Fetal Status:     Movement: Present     General:  Alert, oriented and cooperative. Patient is in no acute distress.  Skin: Skin is warm and dry. No rash noted.   Cardiovascular: Normal heart rate noted  Respiratory: Normal respiratory effort, no problems with respiration noted  Abdomen: Soft, gravid, appropriate for gestational age. Pain/Pressure: Absent     Pelvic:  Cervical exam deferred        Extremities: Normal range of motion.     Mental Status: Normal mood and affect. Normal behavior. Normal judgment and thought content.   Urinalysis:      Assessment and Plan:  Pregnancy: G1P0 at [redacted]w[redacted]d  1. Supervision of other normal pregnancy, antepartum   Preterm labor symptoms and general obstetric precautions including but not limited to vaginal bleeding, contractions, leaking of fluid and fetal movement were reviewed in detail with the patient. Please refer to After Visit Summary for other counseling recommendations.   Return in about 2 weeks (around 05/26/2021) for ROB.   Brock Bad, MD  05/12/21

## 2021-05-26 ENCOUNTER — Other Ambulatory Visit: Payer: Self-pay

## 2021-05-26 ENCOUNTER — Ambulatory Visit (INDEPENDENT_AMBULATORY_CARE_PROVIDER_SITE_OTHER): Payer: Medicaid Other | Admitting: Obstetrics & Gynecology

## 2021-05-26 VITALS — BP 118/70 | HR 98 | Wt 168.4 lb

## 2021-05-26 DIAGNOSIS — Z3480 Encounter for supervision of other normal pregnancy, unspecified trimester: Secondary | ICD-10-CM

## 2021-05-26 DIAGNOSIS — O99012 Anemia complicating pregnancy, second trimester: Secondary | ICD-10-CM

## 2021-05-26 NOTE — Progress Notes (Signed)
3  PRENATAL VISIT NOTE 3 Subjective:  Patricia Bautista is a 20 y.o. G1P0 at [redacted]w[redacted]d being seen today for ongoing prenatal care.  She is currently monitored for the following issues for this low-risk pregnancy and has Supervision of other normal pregnancy, antepartum; [redacted] weeks gestation of pregnancy; Nausea and vomiting in pregnancy; and Anemia complicating pregnancy in second trimester on their problem list.  Patient reports no complaints.  Contractions: Not present. Vag. Bleeding: None.  Movement: Present. Denies leaking of fluid.  3 The following portions of the patient's history were reviewed and updated as appropriate: allergies, current medications, past family history, past medical history, past social history, past surgical history and problem list.   Objective:   Vitals:   05/26/21 1556  BP: 118/70  Pulse: 98  Weight: 168 lb 6.4 oz (76.4 kg)    Fetal Status: Fetal Heart Rate (bpm): 166 Fundal Height: 5 cm Movement: Present     General:  Alert, oriented and cooperative. Patient is in no acute distress.  Skin: Skin is warm and dry. No rash noted.   Cardiovascular: Normal heart rate noted  Respiratory: Normal respiratory effort, no problems with respiration noted  Abdomen: Soft, gravid, appropriate for gestational age.  Pain/Pressure: Absent     Pelvic: Cervical exam deferred        Extremities: Normal range of motion.     Mental Status: Normal mood and affect. Normal behavior. Normal judgment and thought content.   Assessment and Plan:  Pregnancy: G1P0 at [redacted]w[redacted]d 1. Anemia complicating pregnancy in second trimester Had good response after Venofer infusion, taking oral iron  2. Supervision of other normal pregnancy, antepartum Doing well  Preterm labor symptoms and general obstetric precautions including but not limited to vaginal bleeding, contractions, leaking of fluid and fetal movement were reviewed in detail with the patient. Please refer to After Visit Summary for other  counseling recommendations.   Return in about 2 weeks (around 06/09/2021). GBS next  Future Appointments  Date Time Provider Department Center  05/26/2021  4:10 PM Adam Phenix, MD [facility] None    Scheryl Darter, MD

## 2021-06-11 ENCOUNTER — Ambulatory Visit (INDEPENDENT_AMBULATORY_CARE_PROVIDER_SITE_OTHER): Payer: Medicaid Other | Admitting: Obstetrics and Gynecology

## 2021-06-11 ENCOUNTER — Other Ambulatory Visit: Payer: Self-pay

## 2021-06-11 ENCOUNTER — Other Ambulatory Visit (HOSPITAL_COMMUNITY)
Admission: RE | Admit: 2021-06-11 | Discharge: 2021-06-11 | Disposition: A | Discharge status: Home / Self Care | Payer: Medicaid Other | Source: Ambulatory Visit | Attending: Obstetrics and Gynecology | Admitting: Obstetrics and Gynecology

## 2021-06-11 VITALS — BP 114/75 | HR 97 | Wt 171.0 lb

## 2021-06-11 DIAGNOSIS — Z3480 Encounter for supervision of other normal pregnancy, unspecified trimester: Secondary | ICD-10-CM

## 2021-06-11 DIAGNOSIS — Z3490 Encounter for supervision of normal pregnancy, unspecified, unspecified trimester: Secondary | ICD-10-CM | POA: Diagnosis present

## 2021-06-11 DIAGNOSIS — Z3A36 36 weeks gestation of pregnancy: Secondary | ICD-10-CM | POA: Insufficient documentation

## 2021-06-11 NOTE — Progress Notes (Signed)
+   fetal movement. No complaints.  

## 2021-06-11 NOTE — Progress Notes (Signed)
° °  PRENATAL VISIT NOTE  Subjective:  Patricia Bautista is a 20 y.o. G1P0 at [redacted]w[redacted]d being seen today for ongoing prenatal care.  She is currently monitored for the following issues for this low-risk pregnancy and has Supervision of other normal pregnancy, antepartum; Anemia complicating pregnancy in second trimester; and [redacted] weeks gestation of pregnancy on their problem list.  Patient doing well with no acute concerns today. She reports no complaints.  Contractions: Not present. Vag. Bleeding: None.  Movement: Present. Denies leaking of fluid.   The following portions of the patient's history were reviewed and updated as appropriate: allergies, current medications, past family history, past medical history, past social history, past surgical history and problem list. Problem list updated.  Objective:   Vitals:   06/11/21 0846  BP: 114/75  Pulse: 97  Weight: 171 lb (77.6 kg)    Fetal Status: Fetal Heart Rate (bpm): 154 Fundal Height: 37 cm Movement: Present     General:  Alert, oriented and cooperative. Patient is in no acute distress.  Skin: Skin is warm and dry. No rash noted.   Cardiovascular: Normal heart rate noted  Respiratory: Normal respiratory effort, no problems with respiration noted  Abdomen: Soft, gravid, appropriate for gestational age.  Pain/Pressure: Absent     Pelvic: Cervical exam deferred        Extremities: Normal range of motion.  Edema: Trace  Mental Status:  Normal mood and affect. Normal behavior. Normal judgment and thought content.   Assessment and Plan:  Pregnancy: G1P0 at [redacted]w[redacted]d  1. [redacted] weeks gestation of pregnancy   2. Supervision of other normal pregnancy, antepartum Continue routine care - Culture, beta strep (group b only) - Cervicovaginal ancillary only( [place])  Term labor symptoms and general obstetric precautions including but not limited to vaginal bleeding, contractions, leaking of fluid and fetal movement were reviewed in detail with the  patient.  Please refer to After Visit Summary for other counseling recommendations.   Return in about 1 week (around 06/18/2021) for virtual, ROB.   Mariel Aloe, MD Faculty Attending Center for [facility]

## 2021-06-12 LAB — CERVICOVAGINAL ANCILLARY ONLY
Chlamydia: NEGATIVE
Comment: NEGATIVE
Comment: NEGATIVE
Comment: NORMAL
Neisseria Gonorrhea: NEGATIVE
Trichomonas: NEGATIVE

## 2021-06-14 NOTE — L&D Delivery Note (Signed)
Nyjah Schwake is a 21 y.o. female G1P0000 with IUP at [redacted]w[redacted]d admitted for spontaneous onset of labor.  She progressed without augmentation to complete and pushed 10 minutes to deliver.  Cord clamping delayed by several minutes then clamped by CNM and cut by patient's father.    Delivery Note At 3:39 AM a viable female was delivered via Vaginal, Spontaneous (Presentation: Left Occiput Anterior).  APGAR: 8, 9; weight pending.   Placenta status: Spontaneous, Intact.  Cord: 3 vessels with the following complications: Nuchal x1  Anesthesia: Local Episiotomy: None Lacerations:  2nd degree, bilateral sulcus and bilateral labial Suture Repair: 3.0 monocryl , 4.0 monocryl Est. Blood Loss (mL):  150  Dr. Despina Hidden called to bedside and completed repair.   Mom to postpartum.  Baby to Couplet care / Skin to Skin.  Rolm Bookbinder CNM 07/06/2021, 4:34 AM

## 2021-06-15 LAB — CULTURE, BETA STREP (GROUP B ONLY): Strep Gp B Culture: POSITIVE — AB

## 2021-06-18 ENCOUNTER — Encounter: Payer: Medicaid Other | Admitting: Obstetrics and Gynecology

## 2021-06-19 ENCOUNTER — Encounter: Payer: Self-pay | Admitting: Obstetrics and Gynecology

## 2021-06-19 ENCOUNTER — Telehealth (INDEPENDENT_AMBULATORY_CARE_PROVIDER_SITE_OTHER): Payer: Medicaid Other | Admitting: Obstetrics and Gynecology

## 2021-06-19 ENCOUNTER — Other Ambulatory Visit: Payer: Self-pay

## 2021-06-19 VITALS — BP 118/73 | HR 89

## 2021-06-19 DIAGNOSIS — Z3480 Encounter for supervision of other normal pregnancy, unspecified trimester: Secondary | ICD-10-CM

## 2021-06-19 DIAGNOSIS — Z3483 Encounter for supervision of other normal pregnancy, third trimester: Secondary | ICD-10-CM

## 2021-06-19 DIAGNOSIS — Z3A37 37 weeks gestation of pregnancy: Secondary | ICD-10-CM

## 2021-06-19 NOTE — Progress Notes (Signed)
° °  OBSTETRICS PRENATAL VIRTUAL VISIT ENCOUNTER NOTE  Provider location: Center for [place] at [facility]   Patient location: Home  I connected with Truitt Leep on 06/19/21 at  9:15 AM EST by MyChart Video Encounter and verified that I am speaking with the correct person using two identifiers. I discussed the limitations, risks, security and privacy concerns of performing an evaluation and management service virtually and the availability of in person appointments. I also discussed with the patient that there may be a patient responsible charge related to this service. The patient expressed understanding and agreed to proceed. Subjective:  Patricia Bautista is a 21 y.o. G1P0 at [redacted]w[redacted]d being seen today for ongoing prenatal care.  She is currently monitored for the following issues for this low-risk pregnancy and has Supervision of other normal pregnancy, antepartum; Anemia complicating pregnancy in second trimester; [redacted] weeks gestation of pregnancy; and [redacted] weeks gestation of pregnancy on their problem list.  Patient reports no complaints.  Contractions: Not present. Vag. Bleeding: None.  Movement: Present. Denies any leaking of fluid.   The following portions of the patient's history were reviewed and updated as appropriate: allergies, current medications, past family history, past medical history, past social history, past surgical history and problem list.   Objective:   Vitals:   06/19/21 0857  BP: 118/73  Pulse: 89    Fetal Status:     Movement: Present     General:  Alert, oriented and cooperative. Patient is in no acute distress.  Respiratory: Normal respiratory effort, no problems with respiration noted  Mental Status: Normal mood and affect. Normal behavior. Normal judgment and thought content.  Rest of physical exam deferred due to type of encounter  Imaging: No results found.  Assessment and Plan:  Pregnancy: G1P0 at [redacted]w[redacted]d 1. Supervision of other normal pregnancy,  antepartum Continue routine care, discussed filling out FMLA forms, pt will bring in by next visit  2. [redacted] weeks gestation of pregnancy   Term labor symptoms and general obstetric precautions including but not limited to vaginal bleeding, contractions, leaking of fluid and fetal movement were reviewed in detail with the patient. I discussed the assessment and treatment plan with the patient. The patient was provided an opportunity to ask questions and all were answered. The patient agreed with the plan and demonstrated an understanding of the instructions. The patient was advised to call back or seek an in-person office evaluation/go to MAU at [facility] for any urgent or concerning symptoms. Please refer to After Visit Summary for other counseling recommendations.   I provided 10 minutes of face-to-face time during this encounter.  Return in about 1 week (around 06/26/2021) for ROB, in person.  Future Appointments  Date Time Provider [place]  06/19/2021  9:15 AM Griffin Basil, MD [facility] None  06/23/2021  9:55 AM Griffin Basil, MD [place] None  07/01/2021 10:35 AM Radene Gunning, MD [place] None    Griffin Basil, MD Center for [facility], [place]

## 2021-06-23 ENCOUNTER — Other Ambulatory Visit: Payer: Self-pay

## 2021-06-23 ENCOUNTER — Ambulatory Visit (INDEPENDENT_AMBULATORY_CARE_PROVIDER_SITE_OTHER): Payer: Medicaid Other | Admitting: Obstetrics and Gynecology

## 2021-06-23 VITALS — BP 119/77 | HR 122 | Wt 173.0 lb

## 2021-06-23 DIAGNOSIS — Z3A38 38 weeks gestation of pregnancy: Secondary | ICD-10-CM | POA: Insufficient documentation

## 2021-06-23 DIAGNOSIS — Z3480 Encounter for supervision of other normal pregnancy, unspecified trimester: Secondary | ICD-10-CM

## 2021-06-23 NOTE — Progress Notes (Signed)
° °  PRENATAL VISIT NOTE  Subjective:  Cortlyn Cannell is a 21 y.o. G1P0 at [redacted]w[redacted]d being seen today for ongoing prenatal care.  She is currently monitored for the following issues for this low-risk pregnancy and has Supervision of other normal pregnancy, antepartum; Anemia complicating pregnancy in second trimester; [redacted] weeks gestation of pregnancy; [redacted] weeks gestation of pregnancy; and [redacted] weeks gestation of pregnancy on their problem list.  Patient doing well with no acute concerns today. She reports backache and lower groin pain .  Contractions: Not present. Vag. Bleeding: None.  Movement: Present. Denies leaking of fluid.   The following portions of the patient's history were reviewed and updated as appropriate: allergies, current medications, past family history, past medical history, past social history, past surgical history and problem list. Problem list updated.  Objective:   Vitals:   06/23/21 1001  BP: 119/77  Pulse: (!) 122  Weight: 173 lb (78.5 kg)    Fetal Status: Fetal Heart Rate (bpm): 151 Fundal Height: 38 cm Movement: Present  Presentation: Vertex  General:  Alert, oriented and cooperative. Patient is in no acute distress.  Skin: Skin is warm and dry. No rash noted.   Cardiovascular: Normal heart rate noted  Respiratory: Normal respiratory effort, no problems with respiration noted  Abdomen: Soft, gravid, appropriate for gestational age.  Pain/Pressure: Present     Pelvic: Cervical exam deferred        Extremities: Normal range of motion.  Edema: Trace  Mental Status:  Normal mood and affect. Normal behavior. Normal judgment and thought content.   Assessment and Plan:  Pregnancy: G1P0 at [redacted]w[redacted]d  1. [redacted] weeks gestation of pregnancy   2. Supervision of other normal pregnancy, antepartum Continue routine care, discuss IOL at next visit  Term labor symptoms and general obstetric precautions including but not limited to vaginal bleeding, contractions, leaking of fluid and  fetal movement were reviewed in detail with the patient.  Please refer to After Visit Summary for other counseling recommendations.   Return in about 1 week (around 06/30/2021) for ROB, in person.   Mariel Aloe, MD Faculty Attending Center for [facility]

## 2021-06-23 NOTE — Progress Notes (Signed)
+   Fetal movement. No complaints.  

## 2021-07-01 ENCOUNTER — Ambulatory Visit (INDEPENDENT_AMBULATORY_CARE_PROVIDER_SITE_OTHER): Payer: Medicaid Other | Admitting: Obstetrics and Gynecology

## 2021-07-01 ENCOUNTER — Other Ambulatory Visit: Payer: Self-pay | Admitting: Advanced Practice Midwife

## 2021-07-01 ENCOUNTER — Other Ambulatory Visit: Payer: Self-pay

## 2021-07-01 ENCOUNTER — Encounter: Payer: Self-pay | Admitting: Obstetrics and Gynecology

## 2021-07-01 VITALS — BP 123/78 | HR 73 | Wt 175.5 lb

## 2021-07-01 DIAGNOSIS — Z3403 Encounter for supervision of normal first pregnancy, third trimester: Secondary | ICD-10-CM | POA: Diagnosis not present

## 2021-07-01 DIAGNOSIS — Z3480 Encounter for supervision of other normal pregnancy, unspecified trimester: Secondary | ICD-10-CM

## 2021-07-01 DIAGNOSIS — O99012 Anemia complicating pregnancy, second trimester: Secondary | ICD-10-CM

## 2021-07-01 NOTE — Progress Notes (Signed)
° °  PRENATAL VISIT NOTE  Subjective:  Patricia Bautista is a 21 y.o. G1P0 at [redacted]w[redacted]d being seen today for ongoing prenatal care.  She is currently monitored for the following issues for this low-risk pregnancy and has Supervision of other normal pregnancy, antepartum and Anemia complicating pregnancy in second trimester on their problem list.  Patient reports no complaints.  Contractions: Not present. Vag. Bleeding: None.  Movement: Present. Denies leaking of fluid.   The following portions of the patient's history were reviewed and updated as appropriate: allergies, current medications, past family history, past medical history, past social history, past surgical history and problem list.   Objective:   Vitals:   07/01/21 1047  BP: 123/78  Pulse: 73  Weight: 175 lb 8 oz (79.6 kg)    Fetal Status: Fetal Heart Rate (bpm): 156 Fundal Height: 39 cm Movement: Present  Presentation: Vertex  General:  Alert, oriented and cooperative. Patient is in no acute distress.  Skin: Skin is warm and dry. No rash noted.   Cardiovascular: Normal heart rate noted  Respiratory: Normal respiratory effort, no problems with respiration noted  Abdomen: Soft, gravid, appropriate for gestational age.  Pain/Pressure: Absent     Pelvic: Cervical exam performed in the presence of a chaperone Dilation: 3 Effacement (%): 70 Station: -1  Extremities: Normal range of motion.  Edema: Trace  Mental Status: Normal mood and affect. Normal behavior. Normal judgment and thought content.   Assessment and Plan:  Pregnancy: G1P0 at [redacted]w[redacted]d 1. Supervision of other normal pregnancy, antepartum Discussed timing of delivery. Scheduled for IOL on 1/25 at [place]. Reviewed process and expectations. Discussed ARRIVE trial vs prior trials. Discussed regardless, her cervix is favorable so her risk of c-section from induction would be low.  2. Anemia complicating pregnancy in second trimester S/p iron IV and taking PO iron   Term labor  symptoms and general obstetric precautions including but not limited to vaginal bleeding, contractions, leaking of fluid and fetal movement were reviewed in detail with the patient. Please refer to After Visit Summary for other counseling recommendations.   Return in about 1 week (around 07/08/2021) for for induction.  No future appointments.   Milas Hock, MD

## 2021-07-01 NOTE — Progress Notes (Signed)
Pt reports fetal movement, denies pain.  

## 2021-07-02 ENCOUNTER — Other Ambulatory Visit: Payer: Self-pay | Admitting: Obstetrics and Gynecology

## 2021-07-02 ENCOUNTER — Telehealth (HOSPITAL_COMMUNITY): Payer: Self-pay | Admitting: *Deleted

## 2021-07-02 NOTE — Telephone Encounter (Signed)
Preadmission screen  

## 2021-07-03 ENCOUNTER — Encounter (HOSPITAL_COMMUNITY): Payer: Self-pay | Admitting: *Deleted

## 2021-07-03 ENCOUNTER — Telehealth (HOSPITAL_COMMUNITY): Payer: Self-pay | Admitting: *Deleted

## 2021-07-03 NOTE — Telephone Encounter (Signed)
Preadmission screen  

## 2021-07-05 ENCOUNTER — Other Ambulatory Visit: Payer: Self-pay

## 2021-07-05 ENCOUNTER — Encounter (HOSPITAL_COMMUNITY): Payer: Self-pay | Admitting: Family Medicine

## 2021-07-05 ENCOUNTER — Other Ambulatory Visit: Payer: Self-pay | Admitting: Advanced Practice Midwife

## 2021-07-05 ENCOUNTER — Inpatient Hospital Stay (HOSPITAL_COMMUNITY)
Admission: AD | Admit: 2021-07-05 | Discharge: 2021-07-07 | DRG: 807 | Disposition: A | Discharge status: Home / Self Care | Payer: BC Managed Care – PPO | Attending: Obstetrics and Gynecology | Admitting: Obstetrics and Gynecology

## 2021-07-05 DIAGNOSIS — O9902 Anemia complicating childbirth: Secondary | ICD-10-CM | POA: Diagnosis present

## 2021-07-05 DIAGNOSIS — O99012 Anemia complicating pregnancy, second trimester: Secondary | ICD-10-CM

## 2021-07-05 DIAGNOSIS — D509 Iron deficiency anemia, unspecified: Secondary | ICD-10-CM | POA: Diagnosis present

## 2021-07-05 DIAGNOSIS — Z3480 Encounter for supervision of other normal pregnancy, unspecified trimester: Secondary | ICD-10-CM

## 2021-07-05 DIAGNOSIS — Z3A40 40 weeks gestation of pregnancy: Secondary | ICD-10-CM

## 2021-07-05 DIAGNOSIS — Z20822 Contact with and (suspected) exposure to covid-19: Secondary | ICD-10-CM | POA: Diagnosis present

## 2021-07-05 DIAGNOSIS — O26893 Other specified pregnancy related conditions, third trimester: Secondary | ICD-10-CM | POA: Diagnosis present

## 2021-07-05 DIAGNOSIS — Z3490 Encounter for supervision of normal pregnancy, unspecified, unspecified trimester: Secondary | ICD-10-CM

## 2021-07-05 DIAGNOSIS — O99824 Streptococcus B carrier state complicating childbirth: Secondary | ICD-10-CM | POA: Diagnosis present

## 2021-07-05 LAB — TYPE AND SCREEN
ABO/RH(D): A POS
Antibody Screen: NEGATIVE

## 2021-07-05 LAB — CBC
HCT: 38.7 % (ref 36.0–46.0)
Hemoglobin: 12.6 g/dL (ref 12.0–15.0)
MCH: 28.4 pg (ref 26.0–34.0)
MCHC: 32.6 g/dL (ref 30.0–36.0)
MCV: 87.4 fL (ref 80.0–100.0)
Platelets: 232 10*3/uL (ref 150–400)
RBC: 4.43 MIL/uL (ref 3.87–5.11)
RDW: 17.1 % — ABNORMAL HIGH (ref 11.5–15.5)
WBC: 11.5 10*3/uL — ABNORMAL HIGH (ref 4.0–10.5)
nRBC: 0 % (ref 0.0–0.2)

## 2021-07-05 LAB — RESP PANEL BY RT-PCR (FLU A&B, COVID) ARPGX2
Influenza A by PCR: NEGATIVE
Influenza B by PCR: NEGATIVE
SARS Coronavirus 2 by RT PCR: NEGATIVE

## 2021-07-05 MED ORDER — OXYTOCIN-SODIUM CHLORIDE 30-0.9 UT/500ML-% IV SOLN
2.5000 [IU]/h | INTRAVENOUS | Status: DC
  Administered 2021-07-06: 2.5 [IU]/h via INTRAVENOUS
  Filled 2021-07-05: qty 500

## 2021-07-05 MED ORDER — LIDOCAINE HCL (PF) 1 % IJ SOLN
30.0000 mL | INTRAMUSCULAR | Status: AC | PRN
  Administered 2021-07-06: 30 mL via SUBCUTANEOUS
  Filled 2021-07-05: qty 30

## 2021-07-05 MED ORDER — LACTATED RINGERS IV SOLN: INTRAVENOUS | Status: DC

## 2021-07-05 MED ORDER — SODIUM CHLORIDE 0.9 % IV SOLN
5.0000 10*6.[IU] | Freq: Once | INTRAVENOUS | Status: AC
  Administered 2021-07-05: 5 10*6.[IU] via INTRAVENOUS
  Filled 2021-07-05: qty 5

## 2021-07-05 MED ORDER — SOD CITRATE-CITRIC ACID 500-334 MG/5ML PO SOLN: 30.0000 mL | ORAL | Status: DC | PRN

## 2021-07-05 MED ORDER — PENICILLIN G POT IN DEXTROSE 60000 UNIT/ML IV SOLN
3.0000 10*6.[IU] | INTRAVENOUS | Status: DC
  Administered 2021-07-06: 3 10*6.[IU] via INTRAVENOUS
  Filled 2021-07-05: qty 50

## 2021-07-05 MED ORDER — FENTANYL CITRATE (PF) 100 MCG/2ML IJ SOLN
100.0000 ug | INTRAMUSCULAR | Status: DC | PRN
  Administered 2021-07-06: 100 ug via INTRAVENOUS
  Filled 2021-07-05: qty 2

## 2021-07-05 MED ORDER — TERBUTALINE SULFATE 1 MG/ML IJ SOLN: 0.2500 mg | Freq: Once | INTRAMUSCULAR | Status: DC | PRN

## 2021-07-05 MED ORDER — OXYCODONE-ACETAMINOPHEN 5-325 MG PO TABS: 1.0000 | ORAL_TABLET | ORAL | Status: DC | PRN

## 2021-07-05 MED ORDER — ACETAMINOPHEN 325 MG PO TABS: 650.0000 mg | ORAL_TABLET | ORAL | Status: DC | PRN

## 2021-07-05 MED ORDER — OXYTOCIN BOLUS FROM INFUSION
333.0000 mL | Freq: Once | INTRAVENOUS | Status: AC
  Administered 2021-07-06: 333 mL via INTRAVENOUS

## 2021-07-05 MED ORDER — ONDANSETRON HCL 4 MG/2ML IJ SOLN
4.0000 mg | Freq: Four times a day (QID) | INTRAMUSCULAR | Status: DC | PRN
  Administered 2021-07-05: 4 mg via INTRAVENOUS
  Filled 2021-07-05: qty 2

## 2021-07-05 MED ORDER — OXYTOCIN-SODIUM CHLORIDE 30-0.9 UT/500ML-% IV SOLN: 1.0000 m[IU]/min | INTRAVENOUS | Status: DC

## 2021-07-05 MED ORDER — OXYCODONE-ACETAMINOPHEN 5-325 MG PO TABS: 2.0000 | ORAL_TABLET | ORAL | Status: DC | PRN

## 2021-07-05 MED ORDER — LACTATED RINGERS IV SOLN: 500.0000 mL | INTRAVENOUS | Status: DC | PRN

## 2021-07-05 NOTE — H&P (Signed)
OBSTETRIC ADMISSION HISTORY AND PHYSICAL  Patricia Bautista is a 21 y.o. female G1P0000 with IUP at [redacted]w[redacted]d by LMP presenting for early labor, elective IOL. She reports +FMs, No LOF, no VB, no blurry vision, headaches or peripheral edema, and RUQ pain.  She plans on breast feeding. She is unsure what she wants for birth control. She received her prenatal care at  [facility]    Dating: By LMP --->  Estimated Date of Delivery: 07/05/21  Nursing Staff Provider  Office Location [facility] Dating  LMP: 09/28/20 EDD: 07/05/21  Language  English Anatomy US    Flu Vaccine  Declined 10/17 Genetic Screen  NIPS:Panorama normal   AFP:WNL   First Screen:  Quad:    TDaP Vaccine   declined 04/13/21 Hgb A1C or  GTT Early  Third trimester   COVID Vaccine Completed   LAB RESULTS   Rhogam   Blood Type A/Positive/-- (08/17 1043)   Feeding Plan Breast Antibody Negative (08/17 1043)  Contraception Pill  Rubella 9.62 (08/17 1043)  Circumcision If boy, yes RPR Non Reactive (10/31 0805)   Pediatrician   UNSURE HBsAg Negative (08/17 1043)   Support Person Mother, Judeth Cornfield HCVAb   Prenatal Classes  HIV Non Reactive (10/31 0805)     BTL Consent  GBS Positive/-- (12/29 1856) positive (For PCN allergy, check sensitivities)   VBAC Consent  Pap     Hgb Electro    BP Cuff  CF   PHQ-9/GAD-7  [X]  28 WEEKS  [  ] 36 WEEKS SMA     Waterbirth  [ ]  Class [ ]  Consent [ ]  CNM visit    Induction  [ ]  Orders Entered [ ] Foley Y/N   Prenatal History/Complications: anemia of pregnancy  Past Medical History: Past Medical History:  Diagnosis Date   Anemia     Past Surgical History: Past Surgical History:  Procedure Laterality Date   PILONIDAL CYST / SINUS EXCISION      Obstetrical History: OB History     Gravida  1   Para  0   Term  0   Preterm  0   AB  0   Living  0      SAB  0   IAB  0   Ectopic  0   Multiple  0   Live Births  0           Social History Social History   Socioeconomic History    Marital status: Single    Spouse name: Not on file   Number of children: Not on file   Years of education: Not on file   Highest education level: Not on file  Occupational History   Not on file  Tobacco Use   Smoking status: Never   Smokeless tobacco: Never  Vaping Use   Vaping Use: Never used  Substance and Sexual Activity   Alcohol use: Never   Drug use: Never   Sexual activity: Not on file  Other Topics Concern   Not on file  Social History Narrative   Not on file   Social Determinants of Health   Financial Resource Strain: Not on file  Food Insecurity: Not on file  Transportation Needs: Not on file  Physical Activity: Not on file  Stress: Not on file  Social Connections: Not on file    Family History: Family History  Problem Relation Age of Onset   Scoliosis Father    Diabetes Maternal Grandfather     Allergies: No Known  Allergies  Medications Prior to Admission  Medication Sig Dispense Refill Last Dose   Doxylamine-Pyridoxine (DICLEGIS) 10-10 MG TBEC Take 2 tablets by mouth at bedtime. If symptoms persist, add one tablet in the morning and one in the afternoon 100 tablet 5 07/04/2021   ferrous sulfate (FERROUSUL) 325 (65 FE) MG tablet Take 1 tablet (325 mg total) by mouth every other day. 30 tablet 1 07/04/2021   Prenatal MV & Min w/FA-DHA (PRENATAL GUMMIES) 0.18-25 MG CHEW Chew by mouth.   07/04/2021   Review of Systems   All systems reviewed and negative except as stated in HPI  Blood pressure 119/65, pulse 92, temperature 98.3 F (36.8 C), temperature source Oral, resp. rate 16, height 5\' 5"  (1.651 m), weight 77.1 kg, last menstrual period 09/28/2020, SpO2 100 %. General appearance: alert, cooperative, and no distress Lungs: clear to auscultation bilaterally Heart: regular rate and rhythm Abdomen: soft, non-tender; bowel sounds normal Pelvic: n/a Extremities: Homans sign is negative, no sign of DVT DTR's +2 Presentation: cephalic Fetal  monitoringBaseline: 130 bpm, Variability: Good {> 6 bpm), Accelerations: Reactive, and Decelerations: Absent Uterine activity: occasional uc's Dilation: 3 Effacement (%): 80 Station: -1 Exam by:: [email or web address], RN   Prenatal labs: ABO, Rh: A/Positive/-- (08/17 1043) Antibody: Negative (08/17 1043) Rubella: 9.62 (08/17 1043) RPR: Non Reactive (10/31 0805)  HBsAg: Negative (08/17 1043)  HIV: Non Reactive (10/31 0805)  GBS: Positive/-- (12/29 1856)   Prenatal Transfer Tool  Maternal Diabetes: No Genetic Screening: Normal Maternal Ultrasounds/Referrals: Normal Fetal Ultrasounds or other Referrals:  None Maternal Substance Abuse:  No Significant Maternal Medications:  None Significant Maternal Lab Results: Group B Strep positive  No results found for this or any previous visit (from the past 24 hour(s)).  Patient Active Problem List   Diagnosis Date Noted   Pregnancy 07/05/2021   Anemia complicating pregnancy in second trimester 02/02/2021   Supervision of other normal pregnancy, antepartum 01/28/2021    Assessment/Plan:  Patricia Bautista is a 21 y.o. G1P0000 at [redacted]w[redacted]d here for term IOL  #Labor: will recheck in 1-2 hours and if unchanged, start pitocin 2x2 #Pain: Per patient request #FWB: Cat 1 #ID:  GBS pos, PCN #MOF:  breast #MOC: unsure, options reviewed at length #Circ:  N/a  [redacted]w[redacted]d, CNM  07/05/2021, 8:59 PM

## 2021-07-05 NOTE — MAU Note (Signed)
Patricia Bautista is a 21 y.o. at [redacted]w[redacted]d here in MAU reporting: contractions x 1 hour. Denies SROM, but has lost mucous plug over the night, Denies vaginal bleeding or bloody show, had pink discharge on mucous plug. States baby is very active   Pain score: 3 lower back  Vitals:   07/05/21 2008  BP: 131/81  Pulse: 85  Resp: 18  Temp: 98.3 F (36.8 C)  SpO2: 100%     FHT:152bpm Lab orders placed from triage:  mau labor

## 2021-07-06 ENCOUNTER — Other Ambulatory Visit: Payer: Self-pay | Admitting: Advanced Practice Midwife

## 2021-07-06 ENCOUNTER — Encounter (HOSPITAL_COMMUNITY): Payer: Self-pay | Admitting: Obstetrics and Gynecology

## 2021-07-06 DIAGNOSIS — O99824 Streptococcus B carrier state complicating childbirth: Secondary | ICD-10-CM

## 2021-07-06 DIAGNOSIS — O99012 Anemia complicating pregnancy, second trimester: Secondary | ICD-10-CM

## 2021-07-06 DIAGNOSIS — Z3A40 40 weeks gestation of pregnancy: Secondary | ICD-10-CM

## 2021-07-06 LAB — CBC
HCT: 35.7 % — ABNORMAL LOW (ref 36.0–46.0)
Hemoglobin: 11.8 g/dL — ABNORMAL LOW (ref 12.0–15.0)
MCH: 28 pg (ref 26.0–34.0)
MCHC: 33.1 g/dL (ref 30.0–36.0)
MCV: 84.8 fL (ref 80.0–100.0)
Platelets: 255 10*3/uL (ref 150–400)
RBC: 4.21 MIL/uL (ref 3.87–5.11)
RDW: 16.9 % — ABNORMAL HIGH (ref 11.5–15.5)
WBC: 19.6 10*3/uL — ABNORMAL HIGH (ref 4.0–10.5)
nRBC: 0 % (ref 0.0–0.2)

## 2021-07-06 LAB — RPR: RPR Ser Ql: NONREACTIVE

## 2021-07-06 MED ORDER — PHENYLEPHRINE 40 MCG/ML (10ML) SYRINGE FOR IV PUSH (FOR BLOOD PRESSURE SUPPORT): 80.0000 ug | PREFILLED_SYRINGE | INTRAVENOUS | Status: DC | PRN

## 2021-07-06 MED ORDER — ONDANSETRON HCL 4 MG/2ML IJ SOLN: 4.0000 mg | INTRAMUSCULAR | Status: DC | PRN

## 2021-07-06 MED ORDER — PRENATAL MULTIVITAMIN CH
1.0000 | ORAL_TABLET | Freq: Every day | ORAL | Status: DC
  Administered 2021-07-06: 1 via ORAL
  Filled 2021-07-06: qty 1

## 2021-07-06 MED ORDER — LIDOCAINE HCL (PF) 1 % IJ SOLN
INTRAMUSCULAR | Status: AC
  Filled 2021-07-06: qty 30

## 2021-07-06 MED ORDER — DIPHENHYDRAMINE HCL 50 MG/ML IJ SOLN: 12.5000 mg | INTRAMUSCULAR | Status: DC | PRN

## 2021-07-06 MED ORDER — BENZOCAINE-MENTHOL 20-0.5 % EX AERO: 1.0000 "application " | INHALATION_SPRAY | CUTANEOUS | Status: DC | PRN

## 2021-07-06 MED ORDER — ZOLPIDEM TARTRATE 5 MG PO TABS: 5.0000 mg | ORAL_TABLET | Freq: Every evening | ORAL | Status: DC | PRN

## 2021-07-06 MED ORDER — SENNOSIDES-DOCUSATE SODIUM 8.6-50 MG PO TABS
2.0000 | ORAL_TABLET | Freq: Every day | ORAL | Status: DC
  Administered 2021-07-07: 12:00:00 2 via ORAL
  Filled 2021-07-06: qty 2

## 2021-07-06 MED ORDER — IBUPROFEN 600 MG PO TABS
600.0000 mg | ORAL_TABLET | Freq: Four times a day (QID) | ORAL | Status: DC
  Administered 2021-07-06 – 2021-07-07 (×5): 600 mg via ORAL
  Filled 2021-07-06 (×5): qty 1

## 2021-07-06 MED ORDER — SIMETHICONE 80 MG PO CHEW: 80.0000 mg | CHEWABLE_TABLET | ORAL | Status: DC | PRN

## 2021-07-06 MED ORDER — TETANUS-DIPHTH-ACELL PERTUSSIS 5-2.5-18.5 LF-MCG/0.5 IM SUSY: 0.5000 mL | PREFILLED_SYRINGE | Freq: Once | INTRAMUSCULAR | Status: DC

## 2021-07-06 MED ORDER — DIBUCAINE (PERIANAL) 1 % EX OINT: 1.0000 "application " | TOPICAL_OINTMENT | CUTANEOUS | Status: DC | PRN

## 2021-07-06 MED ORDER — DIPHENHYDRAMINE HCL 25 MG PO CAPS: 25.0000 mg | ORAL_CAPSULE | Freq: Four times a day (QID) | ORAL | Status: DC | PRN

## 2021-07-06 MED ORDER — EPHEDRINE 5 MG/ML INJ: 10.0000 mg | INTRAVENOUS | Status: DC | PRN

## 2021-07-06 MED ORDER — ONDANSETRON HCL 4 MG PO TABS: 4.0000 mg | ORAL_TABLET | ORAL | Status: DC | PRN

## 2021-07-06 MED ORDER — FENTANYL-BUPIVACAINE-NACL 0.5-0.125-0.9 MG/250ML-% EP SOLN: 12.0000 mL/h | EPIDURAL | Status: DC | PRN

## 2021-07-06 MED ORDER — LACTATED RINGERS IV SOLN: 500.0000 mL | Freq: Once | INTRAVENOUS | Status: DC

## 2021-07-06 MED ORDER — FERROUS SULFATE 325 (65 FE) MG PO TABS: 325.0000 mg | ORAL_TABLET | ORAL | 1 refills | Status: DC

## 2021-07-06 MED ORDER — WITCH HAZEL-GLYCERIN EX PADS: 1.0000 "application " | MEDICATED_PAD | CUTANEOUS | Status: DC | PRN

## 2021-07-06 MED ORDER — ACETAMINOPHEN 325 MG PO TABS: 650.0000 mg | ORAL_TABLET | ORAL | Status: DC | PRN

## 2021-07-06 MED ORDER — COCONUT OIL OIL: 1.0000 "application " | TOPICAL_OIL | Status: DC | PRN

## 2021-07-06 NOTE — Lactation Note (Signed)
This note was copied from a baby's chart. Lactation Consultation Note  Patient Name: Patricia Bautista Today's Date: 07/06/2021   Age:21 hours  [place] visit was attempted, but Mom was sleeping. Infant noted to be in bassinet. LC to return later.  Matthias Hughs [facility] 07/06/2021, 7:30 AM

## 2021-07-06 NOTE — Lactation Note (Signed)
This note was copied from a baby's chart. Lactation Consultation Note  Patient Name: Girl Althea Backs [id number] Date: 07/06/2021 Reason for consult: Initial assessment;Mother's request;Primapara;1st time breastfeeding;Term;Breastfeeding assistance Age:21 hours  Mother feeding plan breast and supplementation with formula. Infant recent feeding of 15 ml just before LC arrival.  Mom to call for latch assistance with next feeding.   Plan 1. To feed based on cues 8-12x 24hr period. Mom to offer breasts and look for signs of milk transfer.  2. Mom to supplement with EBM first followed by formula. Mom taught hand expression. Supplementation after good latch, 5-7 ml with pace bottle feeding and slow flow nipple. Mom aware to offer more if infant not latching 3. I and O sheet provided.  All questions answered at the end of the visit.   Mom has electric pump at home.   Maternal Data Has patient been taught Hand Expression?: Yes Does the patient have breastfeeding experience prior to this delivery?: No  Feeding Mother's Current Feeding Choice: Breast Milk and Formula Nipple Type: Slow - flow  LATCH Score                    Lactation Tools Discussed/Used    Interventions Interventions: Breast feeding basics reviewed;Skin to skin;Breast massage;Hand express;Breast compression;Position options;Expressed milk;Education;Pace feeding;LC Psychologist, educational;Visual merchandiser education  Discharge WIC Program: Yes  Consult Status Consult Status: Follow-up Date: 07/07/21 Follow-up type: In-patient    Deontay Ladnier  Nicholson-Springer 07/06/2021, 11:27 AM

## 2021-07-06 NOTE — Progress Notes (Signed)
Labor Progress Note Patricia Bautista is a 21 y.o. G1P0000 at [redacted]w[redacted]d presented for spontaneous onset of labor  S:  Patient involuntarily pushing  O:  BP 127/78 (BP Location: Left Arm)    Pulse 87    Temp 98.2 F (36.8 C) (Oral)    Resp 18    Ht 5\' 5"  (1.651 m)    Wt 77.1 kg    LMP 09/28/2020 (Exact Date)    SpO2 99%    BMI 28.29 kg/m   Fetal Tracing:  Baseline: 120 Variability: moderate Accels: 15x15 Decels: early  Toco: 1-2   CVE: Dilation: 7.5 Effacement (%): 80 Cervical Position: Posterior Station: 0 Presentation: Vertex Exam by:: [email or web address], RN   A&P: 21 y.o. G1P0000 [redacted]w[redacted]d spontaneous onset of labor #Labor: Progressing well. Lengthy discussion of importance of not pushing until complete. Per RN report, cervix with significant thickness on patient's left side. Patient declined CNM exam. Patient placed in left side lying with peanut and discussed frequent position changes to facilitate fetal movement.  #Pain: epidural #FWB: Cat1 #GBS positive  [redacted]w[redacted]d, CNM 2:46 AM

## 2021-07-06 NOTE — Progress Notes (Signed)
Labor Progress Note Patricia Bautista is a 21 y.o. G1P0000 at [redacted]w[redacted]d presented for spontaneous onset of labor  S:  Patient reports SROM with clear fluid  O:  BP 134/63    Pulse (!) 103    Temp 98.1 F (36.7 C) (Oral)    Resp 16    Ht 5\' 5"  (1.651 m)    Wt 77.1 kg    LMP 09/28/2020 (Exact Date)    SpO2 100%    BMI 28.29 kg/m   Fetal Tracing:  Baseline: 120 Variability: moderate Accels: 15x15 Decels: none  Toco: 2-3   CVE: Dilation: 5 Effacement (%): 80, 90 Cervical Position: Posterior Station: -1, 0 Presentation: Vertex Exam by:: [email or web address], RN   A&P: 21 y.o. G1P0000 [redacted]w[redacted]d spontaneous onset of labor #Labor: Progressing well. SROM. Will continue to manage expectantly  #Pain: per patient request #FWB: Cat 1 #GBS positive  [redacted]w[redacted]d, CNM 12:50 AM

## 2021-07-06 NOTE — Plan of Care (Signed)
Problem: Education: °Goal: Knowledge of Childbirth will improve °Outcome: Completed/Met °Goal: Ability to make informed decisions regarding treatment and plan of care will improve °Outcome: Completed/Met °Goal: Ability to state and carry out methods to decrease the pain will improve °Outcome: Completed/Met °Goal: Individualized Educational Video(s) °Outcome: Completed/Met °  °Problem: Coping: °Goal: Ability to verbalize concerns and feelings about labor and delivery will improve °Outcome: Completed/Met °  °Problem: Life Cycle: °Goal: Ability to make normal progression through stages of labor will improve °Outcome: Completed/Met °Goal: Ability to effectively push during vaginal delivery will improve °Outcome: Completed/Met °  °Problem: Role Relationship: °Goal: Will demonstrate positive interactions with the child °Outcome: Completed/Met °  °Problem: Safety: °Goal: Risk of complications during labor and delivery will decrease °Outcome: Completed/Met °  °Problem: Pain Management: °Goal: Relief or control of pain from uterine contractions will improve °Outcome: Completed/Met °  °Problem: Education: °Goal: Knowledge of General Education information will improve °Description: Including pain rating scale, medication(s)/side effects and non-pharmacologic comfort measures °Outcome: Completed/Met °  °Problem: Health Behavior/Discharge Planning: °Goal: Ability to manage health-related needs will improve °Outcome: Completed/Met °  °Problem: Clinical Measurements: °Goal: Ability to maintain clinical measurements within normal limits will improve °Outcome: Completed/Met °Goal: Will remain free from infection °Outcome: Completed/Met °Goal: Diagnostic test results will improve °Outcome: Completed/Met °Goal: Respiratory complications will improve °Outcome: Completed/Met °Goal: Cardiovascular complication will be avoided °Outcome: Completed/Met °  °Problem: Activity: °Goal: Risk for activity intolerance will decrease °Outcome:  Completed/Met °  °Problem: Nutrition: °Goal: Adequate nutrition will be maintained °Outcome: Completed/Met °  °Problem: Coping: °Goal: Level of anxiety will decrease °Outcome: Completed/Met °  °Problem: Elimination: °Goal: Will not experience complications related to bowel motility °Outcome: Completed/Met °Goal: Will not experience complications related to urinary retention °Outcome: Completed/Met °  °Problem: Pain Managment: °Goal: General experience of comfort will improve °Outcome: Completed/Met °  °Problem: Safety: °Goal: Ability to remain free from injury will improve °Outcome: Completed/Met °  °Problem: Skin Integrity: °Goal: Risk for impaired skin integrity will decrease °Outcome: Completed/Met °  °

## 2021-07-06 NOTE — Discharge Summary (Signed)
Postpartum Discharge Summary    Patient Name: Patricia Bautista DOB: 11/18/2000 MRN: [id number]  Date of admission: 07/05/2021 Delivery date:07/06/2021  Delivering provider: Wende Mott  Date of discharge: 07/07/2021  Admitting diagnosis: Pregnancy [Z34.90] Intrauterine pregnancy: [redacted]w[redacted]d    Secondary diagnosis:  Principal Problem:   Pregnancy  Additional problems: None    Discharge diagnosis: Term Pregnancy Delivered                                              Postpartum procedures: None Augmentation: N/A Complications: None  Hospital course: Onset of Labor With Vaginal Delivery      21y.o. yo G1P0000 at 450w1das admitted in Latent Labor on 07/05/2021. Patient had an uncomplicated labor course as follows:  Membrane Rupture Time/Date: 12:30 AM ,07/06/2021   Delivery Method:Vaginal, Spontaneous  Episiotomy: None  Lacerations:  Sulcus;2nd degree;Perineal  Patient had an uncomplicated postpartum course.  She is ambulating, tolerating a regular diet, passing flatus, and urinating well. Patient is discharged home in stable condition on 07/07/21.  Newborn Data: Birth date:07/06/2021  Birth time:3:39 AM  Gender:Female  Living status:Living  Apgars:8 ,9  Weight:7 lb 6.9 oz (3.37 kg)   Magnesium Sulfate received: No BMZ received: No Rhophylac:N/A MMR:N/A T-DaP: declined Flu: No Transfusion:No  Physical exam  Vitals:   07/06/21 0800 07/06/21 1200 07/06/21 1500 07/06/21 2220  BP: 115/77 106/66 106/67 (!) 94/46  Pulse: 76 90 100 92  Resp: '18 18 18 18  ' Temp: 98 F (36.7 C) 98.6 F (37 C) 98 F (36.7 C) 98.4 F (36.9 C)  TempSrc: Oral Oral Oral Axillary  SpO2: 97% 97% 100%   Weight:      Height:       General: alert, cooperative, and no distress Lochia: appropriate Uterine Fundus: firm Incision: N/A DVT Evaluation: No evidence of DVT seen on physical exam. Labs: Lab Results  Component Value Date   WBC 19.6 (H) 07/06/2021   HGB 11.8 (L) 07/06/2021   HCT 35.7 (L)  07/06/2021   MCV 84.8 07/06/2021   PLT 255 07/06/2021   No flowsheet data found. Edinburgh Score: No flowsheet data found.   After visit meds:  Allergies as of 07/07/2021   No Known Allergies      Medication List     STOP taking these medications    Doxylamine-Pyridoxine 10-10 MG Tbec Commonly known as: Diclegis       TAKE these medications    acetaminophen 325 MG tablet Commonly known as: Tylenol Take 2 tablets (650 mg total) by mouth every 4 (four) hours as needed (for pain scale < 4).   ferrous sulfate 325 (65 FE) MG tablet Commonly known as: FerrouSul Take 1 tablet (325 mg total) by mouth every other day.   ibuprofen 600 MG tablet Commonly known as: ADVIL Take 1 tablet (600 mg total) by mouth every 6 (six) hours.   Prenatal Gummies 0.18-25 MG Chew Chew by mouth.   senna-docusate 8.6-50 MG tablet Commonly known as: Senokot-S Take 2 tablets by mouth daily.       Discharge home in stable condition Infant Feeding: Bottle and Breast Infant Disposition:home with mother Discharge instruction: per After Visit Summary and Postpartum booklet. Activity: Advance as tolerated. Pelvic rest for 6 weeks.  Diet: routine diet Future Appointments: Future Appointments  Date Time Provider DeChoccolocco2/22/2023 10:55 AM [place]-Kirby, LiKathie Dike  CNM [place] None   Follow up Visit: Please schedule this patient for Postpartum visit in: 4 weeks with the following provider: Any provider In-Person For C/S patients schedule nurse incision check in weeks 2 weeks: no Low risk pregnancy complicated by: n/a Delivery mode:  SVD Anticipated Birth Control:  unsure PP Procedures needed: n/a   07/07/2021 Gabriel Carina, CNM

## 2021-07-07 ENCOUNTER — Inpatient Hospital Stay (HOSPITAL_COMMUNITY)
Admission: AD | Admit: 2021-07-07 | Payer: BC Managed Care – PPO | Source: Home / Self Care | Admitting: Obstetrics and Gynecology

## 2021-07-07 ENCOUNTER — Inpatient Hospital Stay (HOSPITAL_COMMUNITY): Payer: BC Managed Care – PPO

## 2021-07-07 MED ORDER — ACETAMINOPHEN 325 MG PO TABS: 650.0000 mg | ORAL_TABLET | ORAL | 0 refills | Status: AC | PRN

## 2021-07-07 MED ORDER — SENNOSIDES-DOCUSATE SODIUM 8.6-50 MG PO TABS: 2.0000 | ORAL_TABLET | Freq: Every day | ORAL | 0 refills | Status: AC

## 2021-07-07 MED ORDER — FERROUS SULFATE 325 (65 FE) MG PO TABS: 325.0000 mg | ORAL_TABLET | ORAL | 1 refills | Status: AC

## 2021-07-07 MED ORDER — IBUPROFEN 600 MG PO TABS: 600.0000 mg | ORAL_TABLET | Freq: Four times a day (QID) | ORAL | 0 refills | Status: AC

## 2021-07-08 ENCOUNTER — Inpatient Hospital Stay (HOSPITAL_COMMUNITY): Payer: Medicaid Other

## 2021-07-20 ENCOUNTER — Telehealth (HOSPITAL_COMMUNITY): Payer: Self-pay

## 2021-07-20 NOTE — Telephone Encounter (Signed)
°  No answer. Left message to return nurse call.  Marcelino Duster [facility] 07/20/2021,1849

## 2021-08-05 ENCOUNTER — Other Ambulatory Visit: Payer: Self-pay

## 2021-08-05 ENCOUNTER — Ambulatory Visit (INDEPENDENT_AMBULATORY_CARE_PROVIDER_SITE_OTHER): Payer: BC Managed Care – PPO | Admitting: Advanced Practice Midwife

## 2021-08-05 VITALS — BP 107/72 | HR 78 | Wt 148.8 lb

## 2021-08-05 DIAGNOSIS — O924 Hypogalactia: Secondary | ICD-10-CM

## 2021-08-05 DIAGNOSIS — R0981 Nasal congestion: Secondary | ICD-10-CM | POA: Diagnosis not present

## 2021-08-05 NOTE — Patient Instructions (Addendum)
For lactation: Go to [email or web address]  Phone number for [facility]  [phone]

## 2021-08-05 NOTE — Progress Notes (Signed)
..   Post Partum Visit Note  Patricia Bautista is a 21 y.o. G67P1001 female who presents for a postpartum visit. She is 4 week postpartum following a normal spontaneous vaginal delivery.  I have fully reviewed the prenatal and intrapartum course. The delivery was at 40.0 gestational weeks.  Anesthesia: none. Postpartum course has been good. Baby is doing well. Baby is feeding by breast/Bottle- Enfamil Gentlease. Bleeding no bleeding. Bowel function is normal. Bladder function is normal. Patient is not sexually active. Contraception method is none. Postpartum depression screening: negative.   The pregnancy intention screening data noted above was reviewed. Potential methods of contraception were discussed. The patient elected to proceed with No data recorded.   Edinburgh Postnatal Depression Scale - 08/05/21 1116       Edinburgh Postnatal Depression Scale:  In the Past 7 Days   I have been able to laugh and see the funny side of things. 0    I have looked forward with enjoyment to things. 0    I have blamed myself unnecessarily when things went wrong. 0    I have been anxious or worried for no good reason. 0    I have felt scared or panicky for no good reason. 0    Things have been getting on top of me. 0    I have been so unhappy that I have had difficulty sleeping. 0    I have felt sad or miserable. 0    I have been so unhappy that I have been crying. 0    The thought of harming myself has occurred to me. 0    Edinburgh Postnatal Depression Scale Total 0             Health Maintenance Due  Topic Date Due   HPV VACCINES (1 - 2-dose series) Never done   TETANUS/TDAP  Never done   COVID-19 Vaccine (3 - Booster for Moderna series) 01/01/2020   INFLUENZA VACCINE  Never done   PAP-Cervical Cytology Screening  07/04/2021   PAP SMEAR-Modifier  07/04/2021    The following portions of the patient's history were reviewed and updated as appropriate: allergies, current medications, past  family history, past medical history, past social history, past surgical history, and problem list.  Review of Systems Pertinent items noted in HPI and remainder of comprehensive ROS otherwise negative.  Objective:  BP 107/72    Pulse 78    Wt 148 lb 12.8 oz (67.5 kg)    LMP 09/28/2020 (Exact Date)    BMI 24.76 kg/m    VS reviewed, nursing note reviewed,  Constitutional: well developed, well nourished, no distress HEENT: normocephalic CV: normal rate Pulm/chest wall: normal effort Abdomen: soft Neuro: alert and oriented x 3 Skin: warm, dry Psych: affect normal       Assessment:   1. Decreased lactation --Reviewed strategies to increase milk supply.  Pt mostly pumping but reports baby will latch and latch is comfortable, but she does not get enough and gets mad and prefers the bottle.  Pt to try latch more often, with early feeding cues, not when baby is already very hungry.  Skin to skin as often as possible.  More feeding and more pumping for 3-4 days will increase supply. --Suggested pt see lactation, who can weigh baby after feeding to give pt confidence that baby is getting milk when breastfeeding --Praise for all breastfeeding efforts.  Baby is growing well and mom is doing a good job.   2. Postpartum examination  following vaginal delivery --Doing well, bonding well with baby, good support at home. Reports low milk supply, so is supplementing with formula, see above.   3. Nasal congestion --Pt using nasinex nasal spray daily and still has congestion 4 weeks PP.  Recommend switching to saline nasal spray and/or Flonase instead of decongestant spray, which can have rebound effect. F/U with PCP or our office if symptom persists.   Plan:   Essential components of care per ACOG recommendations:  1.  Mood and well being: Patient with negative depression screening today. Reviewed local resources for support.  - Patient tobacco use? No.   - hx of drug use? No.    2. Infant care  and feeding:  -Patient currently breastmilk feeding? Yes. Reviewed importance of draining breast regularly to support lactation.  -Social determinants of health (SDOH) reviewed in EPIC. No concerns   3. Sexuality, contraception and birth spacing - Patient does not want a pregnancy in the next year.   --Discussed pt contraceptive plans and reviewed contraceptive methods based on pt preferences and effectiveness.  Pt prefers abstinence today and will contact the office if she desires to change.  4. Sleep and fatigue -Encouraged family/partner/community support of 4 hrs of uninterrupted sleep to help with mood and fatigue  5. Physical Recovery  - Discussed patients delivery and complications. She describes her labor as good. - Patient had a Vaginal, no problems at delivery. Patient had a 2nd degree laceration. Perineal healing reviewed. Patient expressed understanding - Patient has urinary incontinence? No. - Patient is safe to resume physical and sexual activity  6.  Health Maintenance - HM due items addressed Yes - Last pap smear No results found for: DIAGPAP Pap smear not done at today's visit.  -Breast Cancer screening indicated? No.   7. Chronic Disease/Pregnancy Condition follow up: None  - PCP follow up  Sharen Counter, CNM [facility] [employer], [facility]

## 2022-08-04 DIAGNOSIS — Z6824 Body mass index (BMI) 24.0-24.9, adult: Secondary | ICD-10-CM | POA: Diagnosis not present

## 2022-08-04 DIAGNOSIS — J029 Acute pharyngitis, unspecified: Secondary | ICD-10-CM | POA: Diagnosis not present
